# Patient Record
Sex: Male | Born: 1990 | Race: White | Hispanic: No | Marital: Single | Smoking: Never smoker
Health system: Southern US, Community
[De-identification: ages and names within clinical notes are randomized; demographics above are authoritative.]

---
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
## 2017-09-10 DIAGNOSIS — Y929 Unspecified place or not applicable: Secondary | ICD-10-CM | POA: Insufficient documentation

## 2017-09-10 DIAGNOSIS — Y999 Unspecified external cause status: Secondary | ICD-10-CM | POA: Insufficient documentation

## 2017-09-10 DIAGNOSIS — Y939 Activity, unspecified: Secondary | ICD-10-CM | POA: Diagnosis not present

## 2017-09-10 DIAGNOSIS — S0181XA Laceration without foreign body of other part of head, initial encounter: Secondary | ICD-10-CM | POA: Diagnosis present

## 2017-09-10 DIAGNOSIS — W01198A Fall on same level from slipping, tripping and stumbling with subsequent striking against other object, initial encounter: Secondary | ICD-10-CM | POA: Insufficient documentation

## 2017-09-10 DIAGNOSIS — Z5321 Procedure and treatment not carried out due to patient leaving prior to being seen by health care provider: Secondary | ICD-10-CM | POA: Diagnosis not present

## 2017-09-11 ENCOUNTER — Encounter (HOSPITAL_COMMUNITY): Payer: Self-pay | Admitting: *Deleted

## 2017-09-11 ENCOUNTER — Encounter (HOSPITAL_COMMUNITY): Payer: Self-pay

## 2017-09-11 ENCOUNTER — Emergency Department (HOSPITAL_COMMUNITY)
Admission: EM | Admit: 2017-09-11 | Discharge: 2017-09-11 | Discharge status: Against Medical Advice | Payer: Commercial Managed Care - PPO | Attending: Emergency Medicine | Admitting: Emergency Medicine

## 2017-09-11 ENCOUNTER — Other Ambulatory Visit: Payer: Self-pay

## 2017-09-11 ENCOUNTER — Emergency Department (HOSPITAL_COMMUNITY)
Admission: EM | Admit: 2017-09-11 | Discharge: 2017-09-11 | Disposition: A | Discharge status: Home / Self Care | Payer: Commercial Managed Care - PPO | Source: Home / Self Care | Attending: Emergency Medicine | Admitting: Emergency Medicine

## 2017-09-11 DIAGNOSIS — Y939 Activity, unspecified: Secondary | ICD-10-CM

## 2017-09-11 DIAGNOSIS — Y929 Unspecified place or not applicable: Secondary | ICD-10-CM | POA: Insufficient documentation

## 2017-09-11 DIAGNOSIS — S0181XA Laceration without foreign body of other part of head, initial encounter: Secondary | ICD-10-CM

## 2017-09-11 DIAGNOSIS — Y999 Unspecified external cause status: Secondary | ICD-10-CM | POA: Insufficient documentation

## 2017-09-11 DIAGNOSIS — S0990XA Unspecified injury of head, initial encounter: Secondary | ICD-10-CM

## 2017-09-11 DIAGNOSIS — Z23 Encounter for immunization: Secondary | ICD-10-CM

## 2017-09-11 DIAGNOSIS — W010XXA Fall on same level from slipping, tripping and stumbling without subsequent striking against object, initial encounter: Secondary | ICD-10-CM | POA: Insufficient documentation

## 2017-09-11 MED ORDER — TETANUS-DIPHTH-ACELL PERTUSSIS 5-2.5-18.5 LF-MCG/0.5 IM SUSP
0.5000 mL | Freq: Once | INTRAMUSCULAR | Status: AC
  Administered 2017-09-11: 0.5 mL via INTRAMUSCULAR
  Filled 2017-09-11: qty 0.5

## 2017-09-11 MED ORDER — BACITRACIN ZINC 500 UNIT/GM EX OINT
TOPICAL_OINTMENT | Freq: Two times a day (BID) | CUTANEOUS | Status: DC
  Administered 2017-09-11: 1 via TOPICAL
  Filled 2017-09-11: qty 1.8

## 2017-09-11 NOTE — ED Triage Notes (Signed)
The pt  Is c/o a laceration  To his rt eye he fell on the side of a pool 2-3 hours ago  No pain no loc  Minimal oozing of blood from wound

## 2017-09-11 NOTE — Discharge Instructions (Signed)
You were seen here today for facial laceration. Your laceration occurred approximately 12 hours ago and is outside the window for closure. Please see attached handout on wound care. Please follow-up with your primary care provider. If you develop worsening or new concerning symptoms you can return to the emergency department for re-evaluation.

## 2017-09-11 NOTE — ED Notes (Signed)
Bacitracin applied to R eyebrow

## 2017-09-11 NOTE — ED Triage Notes (Signed)
He states he fell last night, and has a sm. Lac. At left eyebrow area. He is in no distress and ambulates normally.

## 2017-09-11 NOTE — ED Notes (Signed)
Pt. left due to wait being too long. This Tech encouraged Pt. to stay.

## 2017-09-11 NOTE — ED Provider Notes (Signed)
[facility]-EMERGENCY DEPT Provider Note   CSN: [id number] Arrival date & time: 09/11/17  2536     History   Chief Complaint Chief Complaint  Patient presents with  . Head Laceration    HPI Timothy Steele is a 27 y.o. male no significant past medical history presents emergency department today for head laceration.  Patient reports that approximately 9-9:30 PM on 7/20 he was walking beside a pool when he slipped on wet water, falling and hitting the right side of his head.  He denies any loss of consciousness.  He denies any nausea or vomiting since event.  He denies any blood thinner use.  He denies prior head injury.  He denies confusion, amnesia, vertigo, visual changes, focal weakness, neck pain, low back pain, bowel/bladder incontinence, urinary retention or other symptoms.  He notes he does have a small laceration next to the right eyebrow.  This area was cleansed prior to arrival.  He did come to the ED at midnight but left secondary to the wait. It has now been ~12 hours. No aggravating factors.  He is unsure of his last tetanus shot.  HPI  History reviewed. No pertinent past medical history.  There are no active problems to display for this patient.   No past surgical history on file.      Home Medications    Prior to Admission medications   Not on File    Family History No family history on file.  Social History Social History   Tobacco Use  . Smoking status: Never Smoker  . Smokeless tobacco: Never Used  Substance Use Topics  . Alcohol use: Yes  . Drug use: Not on file     Allergies   Patient has no known allergies.   Review of Systems Review of Systems  All other systems reviewed and are negative.    Physical Exam Updated Vital Signs BP 124/77 (BP Location: Right Arm)   Pulse 60   Temp 98.5 F (36.9 C) (Oral)   Resp 18   SpO2 100%   Physical Exam  Constitutional: He appears well-developed and well-nourished.    HENT:  Head: Normocephalic and atraumatic.    Right Ear: External ear normal. No hemotympanum.  Left Ear: External ear normal. No hemotympanum.  Nose: Nose normal.  No CSF otorrhea.  No palpable open or depressed skull fracture.  No hemotympanum, raccoon eyes or battle signs.  Eyes: Conjunctivae are normal. Right eye exhibits no discharge. Left eye exhibits no discharge. No scleral icterus.  Neck:  No C-spine tenderness palpation or step-offs.  Pulmonary/Chest: Effort normal. No respiratory distress.  Musculoskeletal:  No thoracic or lumbar spinous tenderness palpation or step-offs.  Neurological: He is alert.  Mental Status:  Alert, oriented, thought content appropriate, able to give a coherent history. Speech fluent without evidence of aphasia. Able to follow 2 step commands without difficulty.  Cranial Nerves:  II:  Peripheral visual fields grossly normal, pupils equal, round, reactive to light III,IV, VI: ptosis not present, extra-ocular motions intact bilaterally  V,VII: smile symmetric, eyebrows raise symmetric, facial light touch sensation equal VIII: hearing grossly normal to voice  X: uvula elevates symmetrically  XI: bilateral shoulder shrug symmetric and strong XII: midline tongue extension without fassiculations Motor:  Normal tone. 5/5 in upper and lower extremities bilaterally including strong and equal grip strength and dorsiflexion/plantar flexion Sensory: Sensation intact to light touch in all extremities. Negative Romberg.  Deep Tendon Reflexes: 2+ and symmetric in the biceps  and patella Cerebellar: normal finger-to-nose with bilateral upper extremities. Normal heel-to -shin balance bilaterally of the lower extremity. No pronator drift.  Gait: normal gait and balance CV: distal pulses palpable throughout   Skin: Skin is warm and dry. Capillary refill takes less than 2 seconds. Laceration noted. No pallor.  Psychiatric: He has a normal mood and affect.  Nursing  note and vitals reviewed.    ED Treatments / Results  Labs (all labs ordered are listed, but only abnormal results are displayed) Labs Reviewed - No data to display  EKG None  Radiology No results found.  Procedures Procedures (including critical care time)  Medications Ordered in ED Medications  Tdap (BOOSTRIX) injection 0.5 mL (has no administration in time range)     Initial Impression / Assessment and Plan / ED Course  I have reviewed the triage vital signs and the nursing notes.  Pertinent labs & imaging results that were available during my care of the patient were reviewed by me and considered in my medical decision making (see chart for details).     27 year old male presenting with head laceration that occurred up greater than 8 hours ago.  Patient does report head trauma.  No loss of consciousness.  No nausea or vomiting since the event.  He is not on any blood thinners.  He has a normal neurologic exam.  Do not feel he needs CT scan to evaluate.  He denies headache.  He denies symptoms that would make me concerned for concussion.  Patient's wound is not gaping.  Given that it is approximately 12 hours out from time of injury will not closed at this time.  Patient does not have any immunocompromise that would make me start him on antibiotics.  His tetanus was updated. I advised the patient to follow-up with PCP this week. Specific return precautions discussed. Time was given for all questions to be answered. The patient verbalized understanding and agreement with plan. The patient appears safe for discharge home.  Final Clinical Impressions(s) / ED Diagnoses   Final diagnoses:  Facial laceration, initial encounter  Minor head injury, initial encounter    ED Discharge Orders    None       Princella PellegriniMaczis, [place] Sanon M, PA-C 09/11/17 0920    Gwyneth SproutPlunkett, Whitney, MD 09/12/17 2022

## 2017-09-11 NOTE — ED Notes (Signed)
Patient left with family.
